# Patient Record
Sex: Male | Born: 2000 | Race: Black or African American | Hispanic: No | Marital: Single | Smoking: Current every day smoker
Health system: Southern US, Community
[De-identification: ages and names within clinical notes are randomized; demographics above are authoritative.]

## PROBLEM LIST (undated history)

## (undated) DIAGNOSIS — E669 Obesity, unspecified: Secondary | ICD-10-CM

## (undated) HISTORY — DX: Obesity, unspecified: E66.9

---
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
## 2021-01-03 DIAGNOSIS — H5213 Myopia, bilateral: Secondary | ICD-10-CM | POA: Diagnosis not present

## 2021-01-19 ENCOUNTER — Ambulatory Visit: Payer: Medicaid Other | Admitting: Nurse Practitioner

## 2021-04-12 ENCOUNTER — Ambulatory Visit: Payer: Medicaid Other | Admitting: Internal Medicine

## 2021-08-28 ENCOUNTER — Ambulatory Visit: Payer: Medicaid Other | Admitting: Family Medicine

## 2022-05-01 ENCOUNTER — Ambulatory Visit (INDEPENDENT_AMBULATORY_CARE_PROVIDER_SITE_OTHER): Payer: 59 | Admitting: Family Medicine

## 2022-05-01 ENCOUNTER — Encounter: Payer: Self-pay | Admitting: Family Medicine

## 2022-05-01 VITALS — BP 135/88 | HR 76 | Ht 69.0 in | Wt 168.1 lb

## 2022-05-01 DIAGNOSIS — E559 Vitamin D deficiency, unspecified: Secondary | ICD-10-CM

## 2022-05-01 DIAGNOSIS — E0789 Other specified disorders of thyroid: Secondary | ICD-10-CM

## 2022-05-01 DIAGNOSIS — R7301 Impaired fasting glucose: Secondary | ICD-10-CM | POA: Diagnosis not present

## 2022-05-01 DIAGNOSIS — Z1159 Encounter for screening for other viral diseases: Secondary | ICD-10-CM | POA: Diagnosis not present

## 2022-05-01 DIAGNOSIS — F411 Generalized anxiety disorder: Secondary | ICD-10-CM

## 2022-05-01 DIAGNOSIS — E7849 Other hyperlipidemia: Secondary | ICD-10-CM

## 2022-05-01 DIAGNOSIS — L910 Hypertrophic scar: Secondary | ICD-10-CM | POA: Diagnosis not present

## 2022-05-01 NOTE — Assessment & Plan Note (Signed)
GAD-7 is 11 Reports increased life stresses No reports of suicidal thoughts ideation Will like to be with a therapist Referral placed to integrated behavioral health for talk therapy

## 2022-05-01 NOTE — Progress Notes (Signed)
New Patient Office Visit  Subjective:  Patient ID: James Bentley, male    DOB: 06/30/2000  Age: 22 y.o. MRN: [id number]  CC:  Chief Complaint  Patient presents with   New Patient (Initial Visit)    Establishing care. Pt reports warts on his back on and off for years.     HPI James Bentley is a 22 y.o. male presents for establishing care. For the details of today's visit, please refer to the assessment and plan.     Past Medical History:  Diagnosis Date   Obesity     History reviewed. No pertinent surgical history.  Family History  Problem Relation Age of Onset   Hypertension Father    ADD / ADHD Sister     Social History   Socioeconomic History   Marital status: Single    Spouse name: Not on file   Number of children: Not on file   Years of education: Not on file   Highest education level: Not on file  Occupational History   Not on file  Tobacco Use   Smoking status: Every Day    Types: Cigars   Smokeless tobacco: Never  Vaping Use   Vaping Use: Some days  Substance and Sexual Activity   Alcohol use: Never   Drug use: Yes    Types: Marijuana   Sexual activity: Not Currently    Birth control/protection: Abstinence  Other Topics Concern   Not on file  Social History Narrative   Lives with mother, siblings      Rising 11th grade    Social Determinants of Health   Financial Resource Strain: Not on file  Food Insecurity: Not on file  Transportation Needs: Not on file  Physical Activity: Not on file  Stress: Not on file  Social Connections: Not on file  Intimate Partner Violence: Not on file    ROS Review of Systems  Constitutional:  Negative for fatigue and fever.  Eyes:  Negative for visual disturbance.  Respiratory:  Negative for chest tightness and shortness of breath.   Cardiovascular:  Negative for chest pain and palpitations.  Skin:        Wart on back  Neurological:  Negative for dizziness and headaches.    Objective:   Today's  Vitals: BP 135/88   Pulse 76   Ht  (1.753 m)   Wt 168 lb 1.9 oz (76.3 kg)   SpO2 98%   BMI 24.83 kg/m   Physical Exam HENT:     Head: Normocephalic.     Right Ear: External ear normal.     Left Ear: External ear normal.     Nose: No congestion or rhinorrhea.     Mouth/Throat:     Mouth: Mucous membranes are moist.  Cardiovascular:     Rate and Rhythm: Regular rhythm.     Heart sounds: No murmur heard. Pulmonary:     Effort: No respiratory distress.     Breath sounds: Normal breath sounds.  Skin:    Findings: Lesion (2 cm keloid located at the left scapular) present.  Neurological:     Mental Status: He is alert.      Assessment & Plan:   Keloid scar Assessment & Plan: Onset of symptoms for more than 3 years No injury preceding keloid scar that he can remember No pain reported Will like a referral to dermatology for surgical excision Referral placed   Generalized anxiety disorder Assessment & Plan: GAD-7 is 11 Reports  increased life stresses No reports of suicidal thoughts ideation Will like to be with a therapist Referral placed to integrated behavioral health for talk therapy   Vitamin D deficiency -     VITAMIN D 25 Hydroxy (Vit-D Deficiency, Fractures)  Impaired fasting blood sugar -     Hemoglobin A1c  Other hyperlipidemia -     CBC with Differential/Platelet -     CMP14+EGFR -     Lipid panel  Other specified disorders of thyroid -     TSH + free T4  Encounter for hepatitis C screening test for low risk patient -     Hepatitis C antibody  GAD (generalized anxiety disorder) -     Amb ref to Integrated [facility]  Hypertrophic scar -     Ambulatory referral to Dermatology     Follow-up: Return in about 4 months (around 08/31/2022).   Gilmore Laroche, FNP

## 2022-05-01 NOTE — Patient Instructions (Addendum)
I appreciate the opportunity to provide care to you today!    Follow up:  4 months  Labs: please stop by the lab today/during the week to get your blood drawn (CBC, CMP, TSH, Lipid profile, HgA1c, Vit D)  Screening: Hep C  Referrals today-dermatology and integrated behavioral health for talk therapy   Please continue to a heart-healthy diet and increase your physical activities. Try to exercise for at least five days a week.      It was a pleasure to see you and I look forward to continuing to work together on your health and well-being. Please do not hesitate to call the office if you need care or have questions about your care.   Have a wonderful day and week. With Gratitude, Gilmore Laroche MSN, FNP-BC

## 2022-05-01 NOTE — Assessment & Plan Note (Signed)
Onset of symptoms for more than 3 years No injury preceding keloid scar that he can remember No pain reported Will like a referral to dermatology for surgical excision Referral placed

## 2022-05-30 IMAGING — DX DG CHEST 2V
2 series · 2 of 2 positions shown · non-contrast
Comparison: None.

CLINICAL DATA: Left-sided chest pain on inspiration, cough, initial
encounter

EXAM:
CHEST - 2 VIEW

[chest pa]
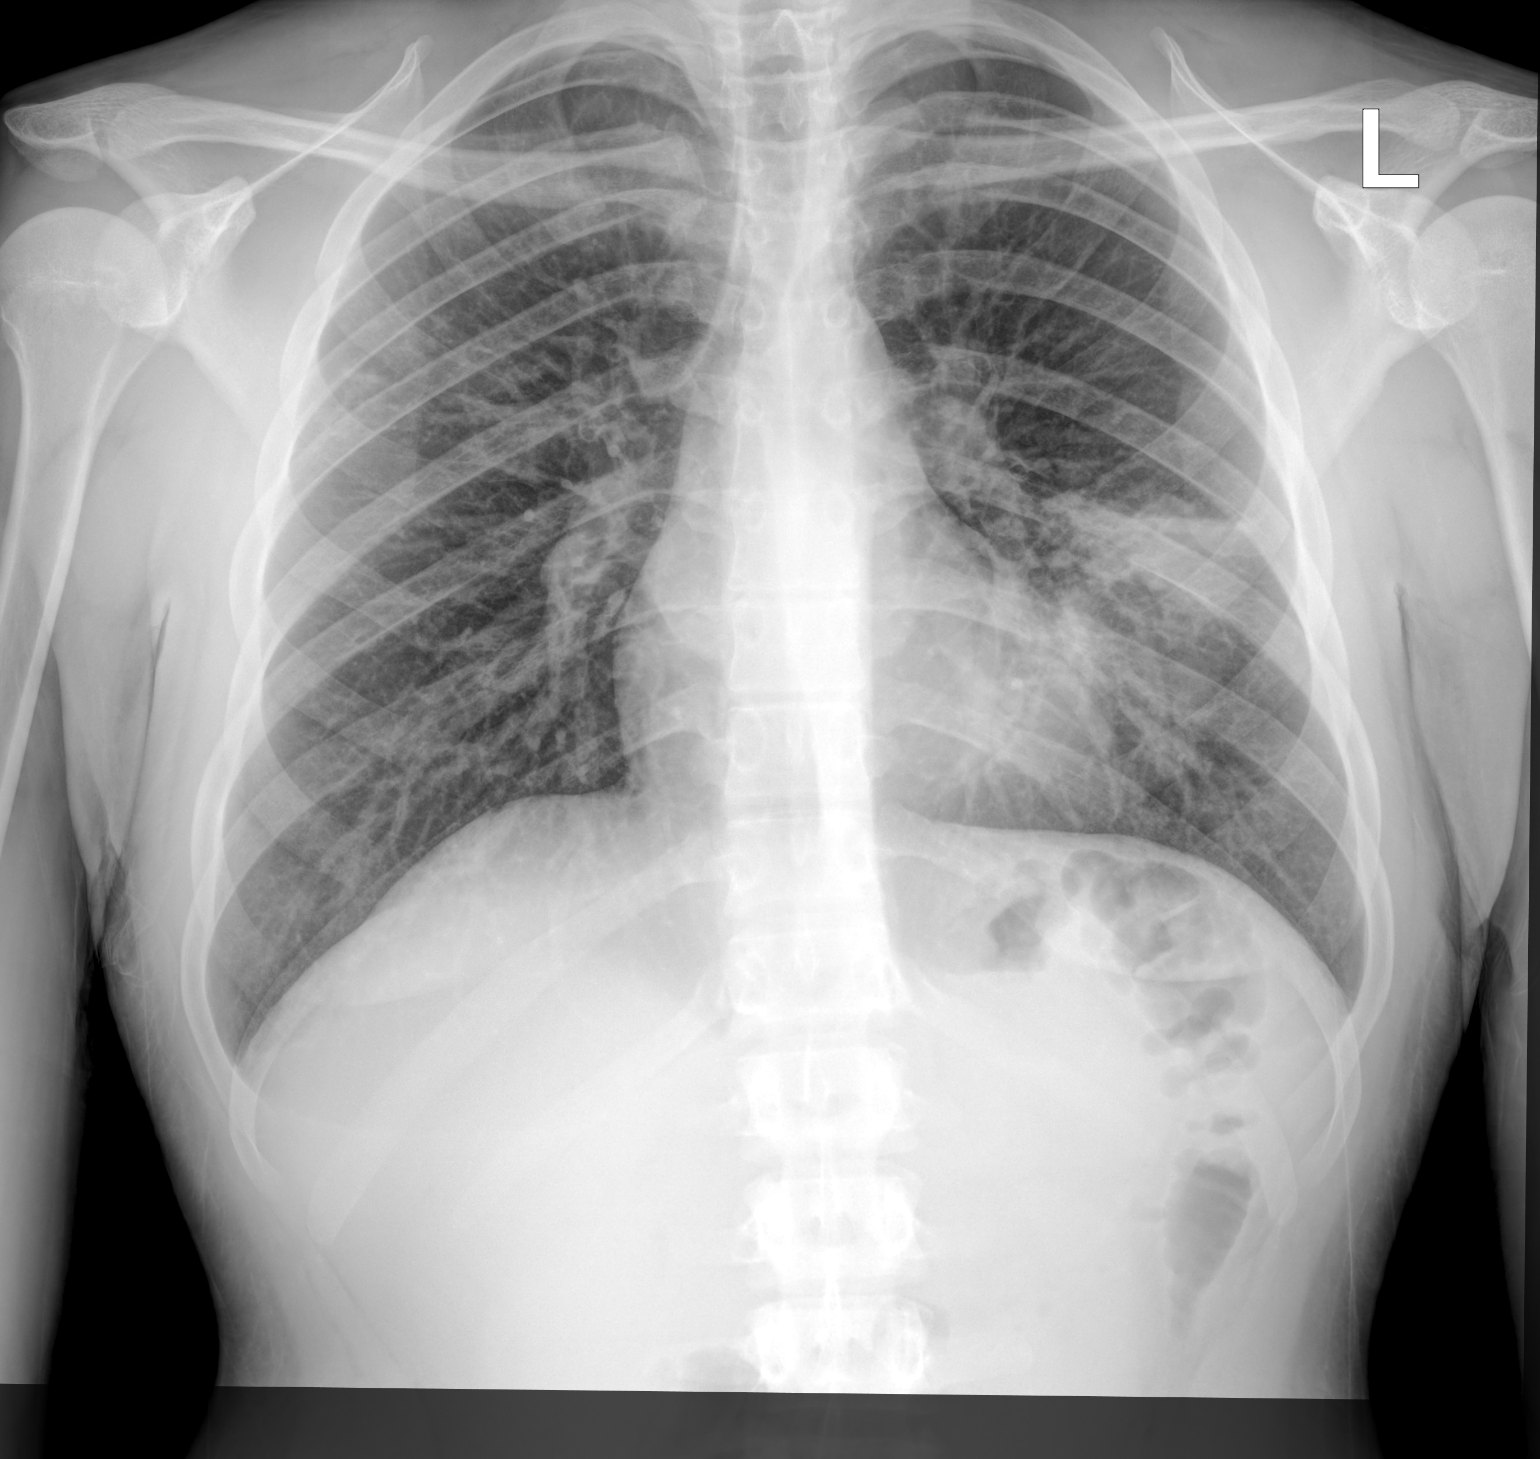

[chest lat]
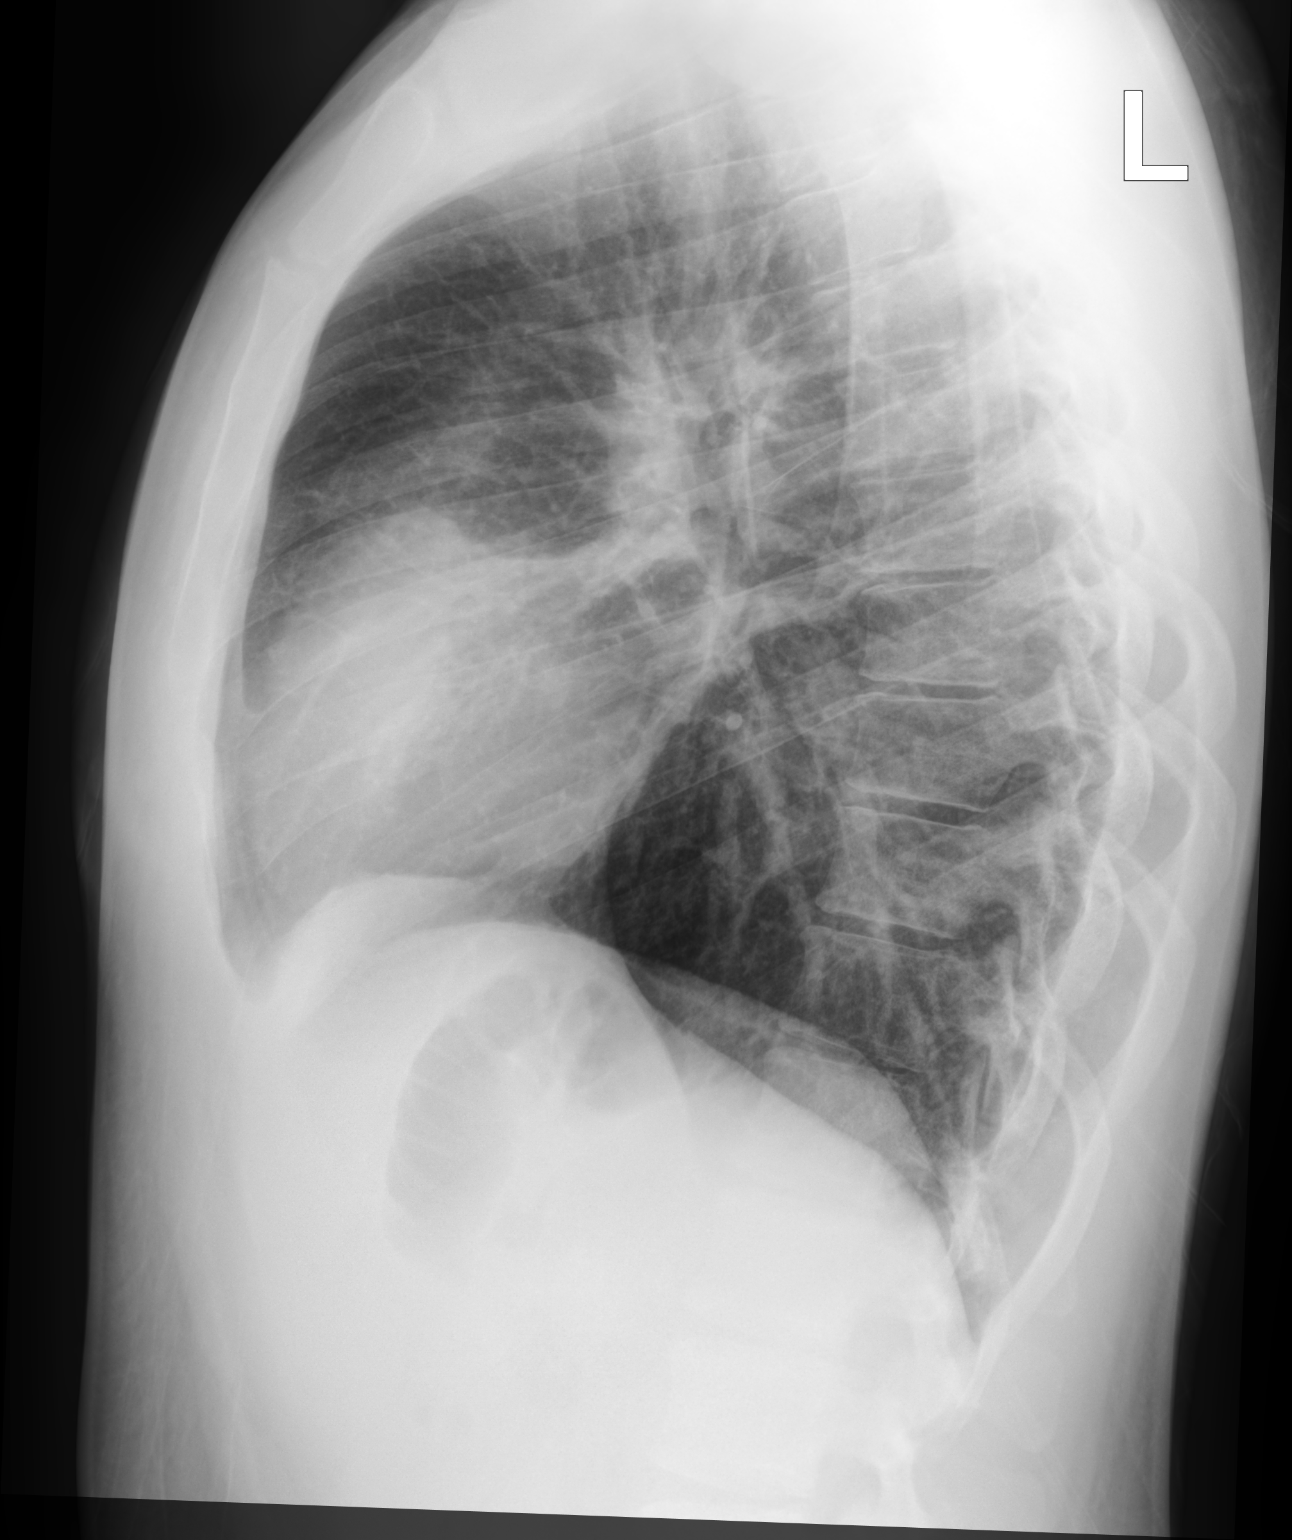

[2 of 2 positions shown; findings below may reference images not displayed]

FINDINGS: Cardiac shadow is within normal limits. The lungs are well aerated
bilaterally. Focal lingular infiltrate is seen without sizable
effusion. No bony abnormality is noted.
IMPRESSION: Left lingular infiltrate.  No other focal abnormality is noted.

## 2022-08-31 ENCOUNTER — Ambulatory Visit: Payer: 59 | Admitting: Family Medicine

## 2022-09-05 ENCOUNTER — Ambulatory Visit: Payer: 59 | Admitting: Family Medicine

## 2022-09-07 ENCOUNTER — Telehealth: Payer: Self-pay

## 2022-09-07 ENCOUNTER — Encounter: Payer: Self-pay | Admitting: Family Medicine

## 2022-09-07 NOTE — Telephone Encounter (Signed)
Error

## 2022-11-07 ENCOUNTER — Encounter: Payer: Self-pay | Admitting: Emergency Medicine

## 2022-11-07 ENCOUNTER — Ambulatory Visit
Admission: EM | Admit: 2022-11-07 | Discharge: 2022-11-07 | Disposition: A | Discharge status: Home / Self Care | Payer: 59 | Attending: Family Medicine | Admitting: Family Medicine

## 2022-11-07 DIAGNOSIS — R0781 Pleurodynia: Secondary | ICD-10-CM

## 2022-11-07 MED ORDER — TIZANIDINE HCL 4 MG PO CAPS: 4.0000 mg | ORAL_CAPSULE | Freq: Three times a day (TID) | ORAL | 0 refills | Status: AC | PRN

## 2022-11-07 MED ORDER — NAPROXEN 500 MG PO TABS: 500.0000 mg | ORAL_TABLET | Freq: Two times a day (BID) | ORAL | 0 refills | Status: AC | PRN

## 2022-11-07 NOTE — ED Triage Notes (Signed)
When coughing he feels a pain in his right side.  Sates also feels this pain when turning or moving since yesterday

## 2022-11-07 NOTE — Discharge Instructions (Signed)
In addition to the prescribed medications as needed, you may use heat, muscle rubs or lidocaine patches to the area, Epsom salt baths, rest.  Follow-up for worsening symptoms.

## 2022-11-07 NOTE — ED Provider Notes (Signed)
[facility]    CSN: [id number] Arrival date & time: 11/07/22  1734      History   Chief Complaint No chief complaint on file.   HPI James Bentley is a 22 y.o. male.   Patient presenting today with a spasming aching pain to the right anterior ribs below the pectoral region for the past few days.  States the pain is worse with movement, heavy lifting which she does a lot of at work or when he coughs after smoking.  Denies shortness of breath, wheezing, fevers, chills, congestion, palpitations, dizziness, bruising, swelling.  So far not trying anything over-the-counter for symptoms.    Past Medical History:  Diagnosis Date   Obesity     Patient Active Problem List   Diagnosis Date Noted   Keloid scar 05/01/2022   Generalized anxiety disorder 05/01/2022   Tinea versicolor 06/18/2017   BMI (body mass index), pediatric 95-99% for age, obese child structured weight management/multidisciplinary intervention category 07/26/2013   Allergic rhinitis due to pollen 07/26/2013    History reviewed. No pertinent surgical history.     Home Medications    Prior to Admission medications   Medication Sig Start Date End Date Taking? Authorizing Provider  naproxen (NAPROSYN) 500 MG tablet Take 1 tablet (500 mg total) by mouth 2 (two) times daily as needed. 11/07/22  Yes Particia Nearing, PA-C  tiZANidine (ZANAFLEX) 4 MG capsule Take 1 capsule (4 mg total) by mouth 3 (three) times daily as needed for muscle spasms. Do not drink alcohol or drive while taking this medication.  May cause drowsiness. 11/07/22  Yes Particia Nearing, PA-C    Family History Family History  Problem Relation Age of Onset   Hypertension Father    ADD / ADHD Sister     Social History Social History   Tobacco Use   Smoking status: Every Day    Types: Cigars   Smokeless tobacco: Never  Vaping Use   Vaping status: Some Days   Substances: Nicotine  Substance Use Topics   Alcohol  use: Never   Drug use: Yes    Types: Marijuana     Allergies   Patient has no known allergies.   Review of Systems Review of Systems PER HPI  Physical Exam Triage Vital Signs ED Triage Vitals  Encounter Vitals Group     BP 11/07/22 1836 116/70     Systolic BP Percentile --      Diastolic BP Percentile --      Pulse Rate 11/07/22 1836 65     Resp 11/07/22 1836 18     Temp 11/07/22 1836 98.2 F (36.8 C)     Temp Source 11/07/22 1836 Oral     SpO2 11/07/22 1836 98 %     Weight --      Height --      Head Circumference --      Peak Flow --      Pain Score 11/07/22 1837 5     Pain Loc --      Pain Education --      Exclude from Growth Chart --    No data found.  Updated Vital Signs BP 116/70 (BP Location: Right Arm)   Pulse 65   Temp 98.2 F (36.8 C) (Oral)   Resp 18   SpO2 98%   Visual Acuity Right Eye Distance:   Left Eye Distance:   Bilateral Distance:    Right Eye Near:   Left Eye  Near:    Bilateral Near:     Physical Exam Vitals and nursing note reviewed.  Constitutional:      Appearance: Normal appearance.  HENT:     Head: Atraumatic.     Mouth/Throat:     Mouth: Mucous membranes are moist.     Pharynx: Oropharynx is clear.  Eyes:     Extraocular Movements: Extraocular movements intact.     Conjunctiva/sclera: Conjunctivae normal.  Cardiovascular:     Rate and Rhythm: Normal rate and regular rhythm.  Pulmonary:     Effort: Pulmonary effort is normal.     Breath sounds: Normal breath sounds. No wheezing or rales.     Comments: Localized right anterior rib ttp below pectoral region, no bony deformity palpable, no bruising or edema. Chest rise symmetric b/l Musculoskeletal:        General: Normal range of motion.     Cervical back: Normal range of motion and neck supple.  Skin:    General: Skin is warm and dry.  Neurological:     General: No focal deficit present.     Mental Status: He is oriented to person, place, and time.     Motor: No  weakness.     Gait: Gait normal.  Psychiatric:        Mood and Affect: Mood normal.        Thought Content: Thought content normal.        Judgment: Judgment normal.      UC Treatments / Results  Labs (all labs ordered are listed, but only abnormal results are displayed) Labs Reviewed - No data to display  EKG   Radiology No results found.  Procedures Procedures (including critical care time)  Medications Ordered in UC Medications - No data to display  Initial Impression / Assessment and Plan / UC Course  I have reviewed the triage vital signs and the nursing notes.  Pertinent labs & imaging results that were available during my care of the patient were reviewed by me and considered in my medical decision making (see chart for details).     Vital signs and exam very reassuring today, suspect muscular strain from lifting/coughing.  Oxygen saturation 98% on room air and lungs clear to auscultation bilaterally.  Pain is reproduced on palpation.  Discussed naproxen, Zanaflex, heat COVID associate with stretches.  X-ray imaging deferred with shared decision making today.  Work note given.  Return for any worsening symptoms.  Final Clinical Impressions(s) / UC Diagnoses   Final diagnoses:  Rib pain on right side     Discharge Instructions      In addition to the prescribed medications as needed, you may use heat, muscle rubs or lidocaine patches to the area, Epsom salt baths, rest.  Follow-up for worsening symptoms.    ED Prescriptions     Medication Sig Dispense Auth. Provider   naproxen (NAPROSYN) 500 MG tablet Take 1 tablet (500 mg total) by mouth 2 (two) times daily as needed. 30 tablet Particia Nearing, [place]   tiZANidine (ZANAFLEX) 4 MG capsule Take 1 capsule (4 mg total) by mouth 3 (three) times daily as needed for muscle spasms. Do not drink alcohol or drive while taking this medication.  May cause drowsiness. 15 capsule Particia Nearing, [place]       PDMP not reviewed this encounter.   Particia Nearing, [place] 11/07/22 1950

## 2022-11-08 ENCOUNTER — Telehealth: Payer: Self-pay

## 2022-11-08 NOTE — Telephone Encounter (Signed)
Pt's mother called stating her son was seen on 11/07/2022 and was written out of work for only 2 days but he needs to be out of work until Monday, and is off on Saturday and Sunday mom was informed that we only do 3 day notes. And she said that was fine she would pick up the note later today around 4:00 pm. She is on his DPR form

## 2024-02-08 ENCOUNTER — Ambulatory Visit
Admission: EM | Admit: 2024-02-08 | Discharge: 2024-02-08 | Disposition: A | Discharge status: Home / Self Care | Attending: Family Medicine | Admitting: Family Medicine

## 2024-02-08 ENCOUNTER — Ambulatory Visit

## 2024-02-08 DIAGNOSIS — R051 Acute cough: Secondary | ICD-10-CM

## 2024-02-08 DIAGNOSIS — R0981 Nasal congestion: Secondary | ICD-10-CM

## 2024-02-08 MED ORDER — PROMETHAZINE-DM 6.25-15 MG/5ML PO SYRP: 5.0000 mL | ORAL_SOLUTION | Freq: Four times a day (QID) | ORAL | 0 refills | Status: AC | PRN

## 2024-02-08 NOTE — ED Triage Notes (Signed)
 Pt reports he has nasal congestion, body aches, headaches, and cough x 1 week  Took mucinex and tylenol cold and FLU

## 2024-02-08 NOTE — Discharge Instructions (Signed)
 For your nasal congestion, you may use over the counter AFRIN nasal spray. This medication is for use in the nose. Take it as directed on the label. Shake well before using. Do not use it more often than directed. Do not use for more than 3 days in a row without talking to your care team first. Make sure that you are using your nasal spray correctly.

## 2024-02-12 NOTE — ED Provider Notes (Signed)
 " [facility]   [id number] 02/08/24 Arrival Time: 1425  ASSESSMENT & PLAN:  1. Acute cough   2. Nasal congestion    I have personally viewed and independently interpreted the imaging studies ordered this visit. CXR: no acute changes.  Likely post-viral cough. OTC symptom care as needed.  Meds ordered this encounter  Medications   promethazine -dextromethorphan (PROMETHAZINE -DM) 6.25-15 MG/5ML syrup    Sig: Take 5 mLs by mouth 4 (four) times daily as needed for cough.    Dispense:  118 mL    Refill:  0     Follow-up Information     [facility].   Specialty: Urgent Care Why: If worsening or failing to improve as anticipated. Contact information: [address], [address] [place]  [phone]                Reviewed expectations re: course of current medical issues. Questions answered. Outlined signs and symptoms indicating need for more acute intervention. Understanding verbalized. After Visit Summary given.   SUBJECTIVE: History from: Patient. James Bentley is a 24 y.o. male. Pt reports he has nasal congestion, body aches, headaches, and cough x 1 week  Took mucinex and tylenol cold and FLU  Denies: fever. Normal PO intake without n/v/d.  OBJECTIVE:  Vitals:   02/08/24 1513  BP: 116/76  Pulse: 84  Resp: 20  Temp: 99.2 F (37.3 C)  TempSrc: Oral  SpO2: 97%    General appearance: alert; no distress Eyes: PERRLA; EOMI; conjunctiva normal HENT: [place]; AT; with nasal congestion Neck: supple  Lungs: speaks full sentences without difficulty; unlabored; active cough Extremities: no edema Skin: warm and dry Neurologic: normal gait Psychological: alert and cooperative; normal mood and affect  Labs:  Labs Reviewed - No data to display  Imaging: DG Chest 2 View Result Date: 02/08/2024 EXAM: 2 VIEW(S) XRAY OF THE CHEST 02/08/2024 03:36:09 PM COMPARISON: Comparison with 10/18/2020.  CLINICAL HISTORY: Cough for one week. FINDINGS: LUNGS AND PLEURA: Previous left mid lung consolidation has resolved. Lungs are clear. No pleural effusion. No pneumothorax. HEART AND MEDIASTINUM: Heart size and pulmonary vascularity are normal. Mediastinal contours appear intact. BONES AND SOFT TISSUES: No acute osseous abnormality. IMPRESSION: 1. No acute cardiopulmonary abnormality. Electronically signed by: Elsie Gravely MD 02/08/2024 03:49 PM EST RP Workstation: [id number]    Allergies[1]  Past Medical History:  Diagnosis Date   Obesity    Social History   Socioeconomic History   Marital status: Single    Spouse name: Not on file   Number of children: Not on file   Years of education: Not on file   Highest education level: Not on file  Occupational History   Not on file  Tobacco Use   Smoking status: Every Day    Types: Cigars   Smokeless tobacco: Never  Vaping Use   Vaping status: Some Days   Substances: Nicotine  Substance and Sexual Activity   Alcohol use: Never   Drug use: Yes    Types: Marijuana   Sexual activity: Not Currently    Birth control/protection: Abstinence  Other Topics Concern   Not on file  Social History Narrative   Lives with mother, siblings      Rising 11th grade    Social Drivers of Health   Tobacco Use: High Risk (02/08/2024)   Patient History    Smoking Tobacco Use: Every Day    Smokeless Tobacco Use: Never    Passive Exposure: Not  on file  Financial Resource Strain: Not on file  Food Insecurity: Not on file  Transportation Needs: Not on file  Physical Activity: Not on file  Stress: Not on file  Social Connections: Not on file  Intimate Partner Violence: Not on file  Depression (PHQ2-9): Medium Risk (05/01/2022)   Depression (PHQ2-9)    PHQ-2 Score: 9  Alcohol Screen: Not on file  Housing: Not on file  Utilities: Not on file  Health Literacy: Not on file   Family History  Problem Relation Age of Onset   Hypertension Father     ADD / ADHD Sister    History reviewed. No pertinent surgical history.    [1] No Known Allergies    Rolinda Rogue, MD 02/12/24 [phone]  "
